# Patient Record
Sex: Male | Born: 1990 | Race: Black or African American | Hispanic: No | Marital: Single | Smoking: Current every day smoker
Health system: Southern US, Community
[De-identification: ages and names within clinical notes are randomized; demographics above are authoritative.]

---

## 2011-12-17 ENCOUNTER — Encounter (HOSPITAL_COMMUNITY): Payer: Self-pay | Admitting: Emergency Medicine

## 2011-12-17 ENCOUNTER — Emergency Department (HOSPITAL_COMMUNITY)
Admission: EM | Admit: 2011-12-17 | Discharge: 2011-12-17 | Disposition: A | Discharge status: Home / Self Care | Payer: BC Managed Care – PPO | Attending: Emergency Medicine | Admitting: Emergency Medicine

## 2011-12-17 DIAGNOSIS — G43909 Migraine, unspecified, not intractable, without status migrainosus: Secondary | ICD-10-CM | POA: Insufficient documentation

## 2011-12-17 DIAGNOSIS — F17200 Nicotine dependence, unspecified, uncomplicated: Secondary | ICD-10-CM | POA: Insufficient documentation

## 2011-12-17 DIAGNOSIS — H538 Other visual disturbances: Secondary | ICD-10-CM | POA: Insufficient documentation

## 2011-12-17 DIAGNOSIS — R51 Headache: Secondary | ICD-10-CM

## 2011-12-17 LAB — URINALYSIS, ROUTINE W REFLEX MICROSCOPIC
Ketones, ur: NEGATIVE mg/dL
Leukocytes, UA: NEGATIVE
Nitrite: NEGATIVE
Specific Gravity, Urine: <1.005 — ABNORMAL LOW (ref 1.005–1.030)
pH: 6.5 (ref 5.0–8.0)

## 2011-12-17 NOTE — ED Provider Notes (Addendum)
History   This chart was scribed for Melvin Hutching, MD by Charolett Bumpers . The patient was seen in room APA16A/APA16A. Patient's care was started at 1452.   CSN: [id number]  Arrival date & time 12/17/11  1343   First MD Initiated Contact with Patient 12/17/11 1452      Chief Complaint  Patient presents with  . Migraine  . Blurred Vision    The history is provided by the patient. No language interpreter was used.   Melvin Grant is a 21 y.o. male who presents to the Emergency Department complaining of constant, severe headache with associated blurry vision that started at work today. He reports it started behind right eye and is not in frontal area. He states his headache has improved and denies taking anything PTA. He denies any h/o headaches or migraines. He denies any recent illnesses. He denies any prior medical hx or regular medications. He states he occasional smokes cigars. He reports occasional alcohol use but none recently.   History reviewed. No pertinent past medical history.  History reviewed. No pertinent past surgical history.  No family history on file.  History  Substance Use Topics  . Smoking status: Current Some Day Smoker  . Smokeless tobacco: Not on file  . Alcohol Use: No      Review of Systems A complete 10 system review of systems was obtained and all systems are negative except as noted in the HPI and PMH.   Allergies  Review of patient's allergies indicates no known allergies.  Home Medications  No current outpatient prescriptions on file.  BP 122/65  Pulse 56  Temp 98.1 F (36.7 C) (Oral)  Resp 20  Ht 5\' 10"  (1.778 m)  Wt 147 lb (66.679 kg)  BMI 21.09 kg/m2  SpO2 100%  Physical Exam  Nursing note and vitals reviewed. Constitutional: He is oriented to person, place, and time. He appears well-developed and well-nourished.  HENT:  Head: Normocephalic and atraumatic.  Eyes: Conjunctivae normal and EOM are normal. Pupils are equal,  round, and reactive to light.  Neck: Normal range of motion. Neck supple.  Cardiovascular: Normal rate and regular rhythm.   No murmur heard. Pulmonary/Chest: Effort normal and breath sounds normal. No respiratory distress. He has no wheezes.  Abdominal: Soft. Bowel sounds are normal. He exhibits no distension. There is no tenderness.  Musculoskeletal: Normal range of motion.  Neurological: He is alert and oriented to person, place, and time. No cranial nerve deficit.  Skin: Skin is warm and dry.  Psychiatric: He has a normal mood and affect.    ED Course  Procedures (including critical care time)  DIAGNOSTIC STUDIES: Oxygen Saturation is 100% on room air, normal by my interpretation.    COORDINATION OF CARE:  15:38-Discussed planned course of treatment with the patient including Tylenol or Motrin for his headache, drink fluids and eating healthy, who is agreeable at this time.   Results for orders placed during the hospital encounter of 12/17/11  URINALYSIS, ROUTINE W REFLEX MICROSCOPIC      Component Value Range   Color, Urine YELLOW  YELLOW   APPearance CLEAR  CLEAR   Specific Gravity, Urine <1.005 (*) 1.005 - 1.030   pH 6.5  5.0 - 8.0   Glucose, UA NEGATIVE  NEGATIVE mg/dL   Hgb urine dipstick NEGATIVE  NEGATIVE   Bilirubin Urine NEGATIVE  NEGATIVE   Ketones, ur NEGATIVE  NEGATIVE mg/dL   Protein, ur NEGATIVE  NEGATIVE mg/dL   Urobilinogen, UA  0.2  0.0 - 1.0 mg/dL   Nitrite NEGATIVE  NEGATIVE   Leukocytes, UA NEGATIVE  NEGATIVE    No results found.   No diagnosis found.    MDM  Normal physical exam. Patient is at minimal risk for any neurological event   I personally performed the services described in this documentation, which was scribed in my presence. The recorded information has been reviewed and considered.       Melvin Hutching, MD 12/17/11 1609  Melvin Hutching, MD 12/17/11 [phone]

## 2011-12-17 NOTE — ED Notes (Addendum)
Pt c/o ha/blurry vision in right eye while at work today. Denies blurred vision at this time. Denies numbness/tingling/weakness. States he has had history of blurred vision/ha with dehydration.

## 2020-04-04 ENCOUNTER — Emergency Department (HOSPITAL_COMMUNITY)
Admission: EM | Admit: 2020-04-04 | Discharge: 2020-04-04 | Disposition: A | Discharge status: Home / Self Care | Payer: Self-pay | Attending: Emergency Medicine | Admitting: Emergency Medicine

## 2020-04-04 ENCOUNTER — Encounter (HOSPITAL_COMMUNITY): Payer: Self-pay

## 2020-04-04 ENCOUNTER — Emergency Department (HOSPITAL_COMMUNITY): Payer: Self-pay

## 2020-04-04 ENCOUNTER — Other Ambulatory Visit: Payer: Self-pay

## 2020-04-04 DIAGNOSIS — S6992XA Unspecified injury of left wrist, hand and finger(s), initial encounter: Secondary | ICD-10-CM

## 2020-04-04 DIAGNOSIS — F17290 Nicotine dependence, other tobacco product, uncomplicated: Secondary | ICD-10-CM | POA: Insufficient documentation

## 2020-04-04 DIAGNOSIS — W208XXA Other cause of strike by thrown, projected or falling object, initial encounter: Secondary | ICD-10-CM | POA: Insufficient documentation

## 2020-04-04 DIAGNOSIS — S60943A Unspecified superficial injury of left middle finger, initial encounter: Secondary | ICD-10-CM | POA: Insufficient documentation

## 2020-04-04 NOTE — ED Provider Notes (Signed)
[facility] EMERGENCY DEPARTMENT Provider Note   CSN: [id number] Arrival date & time: 04/04/20  2109     History Chief Complaint  Patient presents with  . Finger Injury    Melvin Grant is a 30 y.o. male.  HPI   Patient with no significant medical history presents with chief complaint of left middle finger injury.  Patient endorses he was moving shelves and a shelf landed on his left middle finger.  He states he had severe pain after incident, but is able to move his fingers outwith difficulty, states it was bleeding but was able to stop it with direct pressure.  He denies weakness or paresthesias in that finger, he is not immunocompromise, states his last tetanus was within the last 5 years.  He denies any alleviating factors.  Patient denies headaches, fevers, chills, shortness of breath, chest pain, abdominal pain, nausea, vomiting, diarrhea, pedal edema.  History reviewed. No pertinent past medical history.  There are no problems to display for this patient.   History reviewed. No pertinent surgical history.     History reviewed. No pertinent family history.  Social History   Tobacco Use  . Smoking status: Current Every Day Smoker    Types: Cigars  . Smokeless tobacco: Never Used  Substance Use Topics  . Alcohol use: Yes  . Drug use: No    Home Medications Prior to Admission medications   Not on File    Allergies    Patient has no known allergies.  Review of Systems   Review of Systems  Constitutional: Negative for chills and fever.  HENT: Negative for congestion.   Respiratory: Negative for shortness of breath.   Cardiovascular: Negative for chest pain.  Gastrointestinal: Negative for abdominal pain.  Genitourinary: Negative for enuresis.  Musculoskeletal: Negative for back pain.       Left middle finger pain.  Skin: Negative for rash.  Neurological: Negative for dizziness.  Hematological: Does not bruise/bleed easily.    Physical Exam Updated  Vital Signs BP 124/70   Pulse 67   Temp 98.2 F (36.8 C) (Oral)   Resp 18   Ht 5\' 9"  (1.753 m)   Wt 67.1 kg   SpO2 100%   BMI 21.86 kg/m   Physical Exam Vitals and nursing note reviewed.  Constitutional:      General: He is not in acute distress.    Appearance: He is not ill-appearing.  HENT:     Head: Normocephalic and atraumatic.     Nose: No congestion.  Eyes:     Conjunctiva/sclera: Conjunctivae normal.  Cardiovascular:     Rate and Rhythm: Normal rate and regular rhythm.  Pulmonary:     Effort: Pulmonary effort is normal.     Breath sounds: Normal breath sounds.  Musculoskeletal:        General: Swelling, tenderness and signs of injury present. No deformity.     Comments: Patient's left hand was visualized, he had a noted hematoma on the distal end of his left third digit, no nailbed involvement.  He had a small laceration at the distal end along the palmar aspect, 1 mm in length hemodynamically stable.  Patient had full range of motion at his MCP, PIP, DIP joint.  Neurovascular fully intact.  Skin:    General: Skin is warm and dry.  Neurological:     Mental Status: He is alert.  Psychiatric:        Mood and Affect: Mood normal.     ED Results /  Procedures / Treatments   Labs (all labs ordered are listed, but only abnormal results are displayed) Labs Reviewed - No data to display  EKG None  Radiology DG Hand Complete Left  Result Date: 04/04/2020 CLINICAL DATA:  Middle finger injury EXAM: LEFT HAND - COMPLETE 3+ VIEW COMPARISON:  None. FINDINGS: Acute mildly comminuted and displaced fracture involving the tuft of the third distal phalanx. No subluxation. No radiopaque foreign body. IMPRESSION: Acute mildly comminuted and displaced fracture involving the tuft of the third distal phalanx. Electronically Signed   By: Jasmine Pang M.D.   On: 04/04/2020 22:53    Procedures Procedures   Medications Ordered in ED Medications - No data to display  ED Course   I have reviewed the triage vital signs and the nursing notes.  Pertinent labs & imaging results that were available during my care of the patient were reviewed by me and considered in my medical decision making (see chart for details).    MDM Rules/Calculators/A&P                          Initial impression-patient presents with pain in his third left digit.  He is alert, does not appear in acute distress, vital signs reassuring.  Will obtain imaging for further evaluation.  Work-up-left hand x-ray reveals have a mildly communicated and displaced fracture involving the tuft of the third distal phalanx.  Rule out- low suspicion for ligament or tendon damage as area was palpated no gross defects noted, they had full range of motion at all of his joints in his third left digit.  Low suspicion for compartment syndrome as area was palpated it was soft to the touch, neurovascular fully intact.  Plan-patient has a distal tuft fracture will place patient in a static splint and have him follow-up with orthopedic surgery for further evaluation.  Vital signs have remained stable, no indication for hospital admission.  Patient given at home care as well strict return precautions.  Patient verbalized that they understood agreed to said plan.   Final Clinical Impression(s) / ED Diagnoses Final diagnoses:  Injury of finger of left hand, initial encounter    Rx / DC Orders ED Discharge Orders    None       Barnie Del 04/04/20 2304    Bethann Berkshire, MD 04/05/20 1154

## 2020-04-04 NOTE — Discharge Instructions (Addendum)
Seen here with a finger injury.  Imaging shows that you have a mildly communicated and displaced fracture involving the tuft of the third distal phalanx.  I have placed you in a splint please keep on.  I recommend over-the-counter pain medications like ibuprofen and or Tylenol every 6 hours as needed.  I recommend keeping the finger elevated and applying ice to areas as help decrease inflammation and swelling.  Please follow-up with orthopedics for further evaluation.  Please contact your early convenience.  Come back to the emergency department if you develop chest pain, shortness of breath, severe abdominal pain, uncontrolled nausea, vomiting, diarrhea.

## 2020-04-04 NOTE — ED Triage Notes (Signed)
Pt to er, pt states that he was putting in a metal shelf and the shelf dropped down and his L middle finger got pinched and cut, pt has dressing in place, bleeding is controled/ stopped at this time.

## 2020-04-11 ENCOUNTER — Ambulatory Visit (INDEPENDENT_AMBULATORY_CARE_PROVIDER_SITE_OTHER): Payer: Worker's Compensation | Admitting: Orthopedic Surgery

## 2020-04-11 ENCOUNTER — Encounter: Payer: Self-pay | Admitting: Orthopedic Surgery

## 2020-04-11 ENCOUNTER — Other Ambulatory Visit: Payer: Self-pay

## 2020-04-11 VITALS — BP 127/71 | HR 63 | Ht 69.0 in | Wt 138.5 lb

## 2020-04-11 DIAGNOSIS — S62663A Nondisplaced fracture of distal phalanx of left middle finger, initial encounter for closed fracture: Secondary | ICD-10-CM | POA: Diagnosis not present

## 2020-04-11 MED ORDER — HYDROCODONE-ACETAMINOPHEN 5-325 MG PO TABS: 1.0000 | ORAL_TABLET | Freq: Four times a day (QID) | ORAL | 0 refills | Status: DC | PRN

## 2020-04-11 MED ORDER — IBUPROFEN 800 MG PO TABS: 800.0000 mg | ORAL_TABLET | Freq: Three times a day (TID) | ORAL | 1 refills | Status: AC | PRN

## 2020-04-11 NOTE — Progress Notes (Signed)
NEW PROBLEM//OFFICE VISIT  Summary assessment and plan: 53 male left long finger tip inj  AROM  Remove current splint   Work: Patient will be out of work for 2 additional weeks  Given medication for pain  Follow-up in 2 weeks  Chief Complaint  Patient presents with  . Hand Injury    L middle finger/ DOI 04/04/20 Metal door fell on finger at work    30 yo male injured at work  Left long finger tip injury with distal phal fracture  Pain at finger tip      Review of Systems  Constitutional: Negative for chills and fever.  Respiratory: Negative for shortness of breath.   Cardiovascular: Negative for chest pain.   No medical problems reported no surgeries reported   No past medical history on file.  No past surgical history on file.  No family history on file. Social History   Tobacco Use  . Smoking status: Current Every Day Smoker    Types: Cigars  . Smokeless tobacco: Never Used  Substance Use Topics  . Alcohol use: Yes  . Drug use: No    No Known Allergies  Current Meds  Medication Sig  . HYDROcodone-acetaminophen (NORCO/VICODIN) 5-325 MG tablet Take 1 tablet by mouth every 6 (six) hours as needed for moderate pain.  Marland Kitchen ibuprofen (ADVIL) 800 MG tablet Take 1 tablet (800 mg total) by mouth every 8 (eight) hours as needed.    BP 127/71   Pulse 63   Ht 5\' 9"  (1.753 m)   Wt 138 lb 8 oz (62.8 kg)   BMI 20.45 kg/m   Physical Exam Constitutional:      General: He is not in acute distress.    Appearance: He is well-developed.     Comments: Well developed, well nourished Normal grooming and hygiene     Cardiovascular:     Comments: No peripheral edema Musculoskeletal:     Comments: Left long finger :  Tip of the finger has a longitudinal cut no sign of infection no bleeding hypersensitive decreased range of motion DIP and PIP joint alignment normal  Skin:    General: Skin is warm and dry.  Neurological:     Mental Status: He is alert and oriented  to person, place, and time.     Sensory: No sensory deficit.     Coordination: Coordination normal.     Gait: Gait normal.     Deep Tendon Reflexes: Reflexes are normal and symmetric.  Psychiatric:        Mood and Affect: Mood normal.        Behavior: Behavior normal.        Thought Content: Thought content normal.        Judgment: Judgment normal.     Comments: Affect normal       MEDICAL DECISION MAKING  A.  Encounter Diagnosis  Name Primary?  . Closed nondisplaced fracture of distal phalanx of left middle finger, initial encounter Yes    B. DATA ANALYSED:   IMAGING: Interpretation of images: external/ my interpretation is 3 x-rays of the hand of the fingertip shows a comminuted fracture of the distal phalanx with minimal displacement  Orders: no  Outside records reviewed: ER : xrays and splint    C. MANAGEMENT   AROM    Meds ordered this encounter  Medications  . ibuprofen (ADVIL) 800 MG tablet    Sig: Take 1 tablet (800 mg total) by mouth every 8 (eight) hours as needed.  Dispense:  90 tablet    Refill:  1  . HYDROcodone-acetaminophen (NORCO/VICODIN) 5-325 MG tablet    Sig: Take 1 tablet by mouth every 6 (six) hours as needed for moderate pain.    Dispense:  30 tablet    Refill:  0    OFFICE VISITS FOR THIS   Fuller Canada, MD  04/11/2020 11:47 AM

## 2020-04-11 NOTE — Patient Instructions (Addendum)
OOW 2 WEEKS   Smoking Tobacco Information, Adult Smoking tobacco can be harmful to your health. Tobacco contains a poisonous (toxic), colorless chemical called nicotine. Nicotine is addictive. It changes the brain and can make it hard to stop smoking. Tobacco also has other toxic chemicals that can hurt your body and raise your risk of many cancers. How can smoking tobacco affect me? Smoking tobacco puts you at risk for:  Cancer. Smoking is most commonly associated with lung cancer, but can also lead to cancer in other parts of the body.  Chronic obstructive pulmonary disease (COPD). This is a long-term lung condition that makes it hard to breathe. It also gets worse over time.  High blood pressure (hypertension), heart disease, stroke, or heart attack.  Lung infections, such as pneumonia.  Cataracts. This is when the lenses in the eyes become clouded.  Digestive problems. This may include peptic ulcers, heartburn, and gastroesophageal reflux disease (GERD).  Oral health problems, such as gum disease and tooth loss.  Loss of taste and smell. Smoking can affect your appearance by causing:  Wrinkles.  Yellow or stained teeth, fingers, and fingernails. Smoking tobacco can also affect your social life, because:  It may be challenging to find places to smoke when away from home. Many workplaces, [employer], hotels, and public places are tobacco-free.  Smoking is expensive. This is due to the cost of tobacco and the long-term costs of treating health problems from smoking.  Secondhand smoke may affect those around you. Secondhand smoke can cause lung cancer, breathing problems, and heart disease. Children of smokers have a higher risk for: ? Sudden infant death syndrome (SIDS). ? Ear infections. ? Lung infections. If you currently smoke tobacco, quitting now can help you:  Lead a longer and healthier life.  Look, smell, breathe, and feel better over time.  Save money.  Protect  others from the harms of secondhand smoke. What actions can I take to prevent health problems? Quit smoking  Do not start smoking. Quit if you already do.  Make a plan to quit smoking and commit to it. Look for programs to help you and ask your health care provider for recommendations and ideas.  Set a date and write down all the reasons you want to quit.  Let your friends and family know you are quitting so they can help and support you. Consider finding friends who also want to quit. It can be easier to quit with someone else, so that you can support each other.  Talk with your health care provider about using nicotine replacement medicines to help you quit, such as gum, lozenges, patches, sprays, or pills.  Do not replace cigarette smoking with electronic cigarettes, which are commonly called e-cigarettes. The safety of e-cigarettes is not known, and some may contain harmful chemicals.  If you try to quit but return to smoking, stay positive. It is common to slip up when you first quit, so take it one day at a time.  Be prepared for cravings. When you feel the urge to smoke, chew gum or suck on hard candy.   Lifestyle  Stay busy and take care of your body.  Drink enough fluid to keep your urine pale yellow.  Get plenty of exercise and eat a healthy diet. This can help prevent weight gain after quitting.  Monitor your eating habits. Quitting smoking can cause you to have a larger appetite than when you smoke.  Find ways to relax. Go out with friends or family  to a movie or a restaurant where people do not smoke.  Ask your health care provider about having regular tests (screenings) to check for cancer. This may include blood tests, imaging tests, and other tests.  Find ways to manage your stress, such as meditation, yoga, or exercise. Where to find support To get support to quit smoking, consider:  Asking your health care provider for more information and resources.  Taking  classes to learn more about quitting smoking.  Looking for local organizations that offer resources about quitting smoking.  Joining a support group for people who want to quit smoking in your local community.  Calling the smokefree.gov counselor helpline: 1-800-Quit-Now 640-618-8072) Where to find more information You may find more information about quitting smoking from:  HelpGuide.org: www.helpguide.org  [employer]: smokefree.gov  American Lung Association: www.lung.org Contact a health care provider if you:  Have problems breathing.  Notice that your lips, nose, or fingers turn blue.  Have chest pain.  Are coughing up blood.  Feel faint or you pass out.  Have other health changes that cause you to worry. Summary  Smoking tobacco can negatively affect your health, the health of those around you, your finances, and your social life.  Do not start smoking. Quit if you already do. If you need help quitting, ask your health care provider.  Think about joining a support group for people who want to quit smoking in your local community. There are many effective programs that will help you to quit this behavior. This information is not intended to replace advice given to you by your health care provider. Make sure you discuss any questions you have with your health care provider. Document Revised: 10/24/2018 Document Reviewed: 02/14/2016 Elsevier Patient Education  2021 [employer].

## 2020-04-25 ENCOUNTER — Ambulatory Visit (INDEPENDENT_AMBULATORY_CARE_PROVIDER_SITE_OTHER): Payer: Worker's Compensation | Admitting: Orthopedic Surgery

## 2020-04-25 ENCOUNTER — Other Ambulatory Visit: Payer: Self-pay

## 2020-04-25 ENCOUNTER — Encounter: Payer: Self-pay | Admitting: Orthopedic Surgery

## 2020-04-25 VITALS — BP 152/91 | HR 66 | Ht 69.0 in | Wt 139.0 lb

## 2020-04-25 DIAGNOSIS — S62663D Nondisplaced fracture of distal phalanx of left middle finger, subsequent encounter for fracture with routine healing: Secondary | ICD-10-CM | POA: Diagnosis not present

## 2020-04-25 NOTE — Patient Instructions (Signed)
Soak in warm water one teaspoon of salt for several minutes, this will help with healing and pain Rub with Soft tooth brush 10 minutes 3 times daily for a week, then change to Medium tooth brush 10 minutes 3 times daily, then use a hard tooth brush  Try to discontinue the finger splint when you can   Out of work for 3 weeks

## 2020-04-25 NOTE — Progress Notes (Signed)
Chief Complaint  Patient presents with  . Finger Injury    04/04/20 left middle finger    30 year old male status post nondisplaced fracture left middle fingertip  Patient is progressing well still has hypersensitivity to the tip of the finger  His range of motion today was good after we asked him to make a fist  He is 21 days post injury  Recommend desensitization's with a toothbrush therapy soft, medium, hard 1 week each for 10 minutes 3 times a day and then soaking 10 minutes a day with Epson salt  Follow-up in 3 weeks  No work for 3 weeks  Expect return to work in 3 weeks with MMI   Encounter Diagnosis  Name Primary?  . Closed nondisplaced fracture of distal phalanx of left middle finger with routine healing, subsequent encounter Yes

## 2020-05-16 ENCOUNTER — Ambulatory Visit (INDEPENDENT_AMBULATORY_CARE_PROVIDER_SITE_OTHER): Payer: Worker's Compensation | Admitting: Orthopedic Surgery

## 2020-05-16 ENCOUNTER — Other Ambulatory Visit: Payer: Self-pay

## 2020-05-16 ENCOUNTER — Encounter: Payer: Self-pay | Admitting: Orthopedic Surgery

## 2020-05-16 VITALS — BP 132/89 | HR 60 | Ht 69.0 in | Wt 141.0 lb

## 2020-05-16 DIAGNOSIS — S62663D Nondisplaced fracture of distal phalanx of left middle finger, subsequent encounter for fracture with routine healing: Secondary | ICD-10-CM

## 2020-05-16 NOTE — Progress Notes (Addendum)
Chief Complaint  Patient presents with  . Hand Injury    Left middle ring finger, Patient reports doing better     F/u LMF injury with radial distal tip numb   Otherwise pinch is normal; nail is a little hypertrophic   rom is normal   He can return to work Monday

## 2020-05-16 NOTE — Patient Instructions (Signed)
RTW April 11  FINGER COT APPLY TO TIP

## 2022-04-13 ENCOUNTER — Encounter: Payer: Self-pay | Admitting: Orthopedic Surgery

## 2022-04-13 ENCOUNTER — Ambulatory Visit (INDEPENDENT_AMBULATORY_CARE_PROVIDER_SITE_OTHER): Payer: BC Managed Care – PPO | Admitting: Orthopedic Surgery

## 2022-04-13 VITALS — BP 139/91 | HR 78 | Ht 69.0 in | Wt 144.0 lb

## 2022-04-13 DIAGNOSIS — M62830 Muscle spasm of back: Secondary | ICD-10-CM

## 2022-04-13 DIAGNOSIS — M898X1 Other specified disorders of bone, shoulder: Secondary | ICD-10-CM | POA: Diagnosis not present

## 2022-04-13 NOTE — Patient Instructions (Signed)
Give him a note OOW fri sat sun return to work Monday

## 2022-04-13 NOTE — Progress Notes (Signed)
Chief Complaint  Patient presents with   Back Pain    Mid left sided back pain    32 year old male [facility] February when he woke up with pain in his mid thoracic to upper thoracic spine.  He says he woke up and could not move and needed help to even get out of the bed.  He works at a [employer] all day but had no trouble after his most recent time at work.  He does play basketball every Sunday which she did on this occasion but had no issues until he woke up  At [facility] he was given cyclobenzaprine 10 mg Medrol Dosepak 4 mg and ibuprofen 800 mg and he says that he is improving but still has some discomfort in the periscapular region  I called [facility] and I was on the phone with them for 15 minutes as their fax of his notes did not transfer with the font that was readable  In any event his review of systems does not reveal any numbness tingling or neck discomfort currently  No leg pain or red flags  Physical Exam Vitals and nursing note reviewed.  Constitutional:      Appearance: Normal appearance.  HENT:     Head: Normocephalic and atraumatic.  Eyes:     General: No scleral icterus.       Right eye: No discharge.        Left eye: No discharge.     Extraocular Movements: Extraocular movements intact.     Conjunctiva/sclera: Conjunctivae normal.     Pupils: Pupils are equal, round, and reactive to light.  Cardiovascular:     Rate and Rhythm: Normal rate.     Pulses: Normal pulses.  Musculoskeletal:     Left shoulder: Tenderness present. No swelling, deformity, effusion, laceration or crepitus. Normal range of motion. Normal strength. Normal pulse.       Arms:     Thoracic back: Spasms and tenderness present. No swelling, edema, deformity, signs of trauma, lacerations or bony tenderness. Normal range of motion. No scoliosis.     Comments: Periscapular pain as noted in the diagram  Skin:    General: Skin is warm and dry.      Capillary Refill: Capillary refill takes less than 2 seconds.  Neurological:     General: No focal deficit present.     Mental Status: He is alert and oriented to person, place, and time.  Psychiatric:        Mood and Affect: Mood normal.        Behavior: Behavior normal.        Thought Content: Thought content normal.        Judgment: Judgment normal.      Assessment and plan  Encounter Diagnoses  Name Primary?   Periscapular pain Yes   Spasm of thoracic back muscle     Recommend the patient stay out of work until Monday.  He can go back Monday.  He should take his cyclobenzaprine, Medrol and ibuprofen and avoid any activity over the weekend

## 2022-08-08 IMAGING — DX DG HAND COMPLETE 3+V*L*
3 series · 3 of 3 positions shown · non-contrast
Comparison: None.

CLINICAL DATA: Middle finger injury

EXAM:
LEFT HAND - COMPLETE 3+ VIEW

[hand pa]
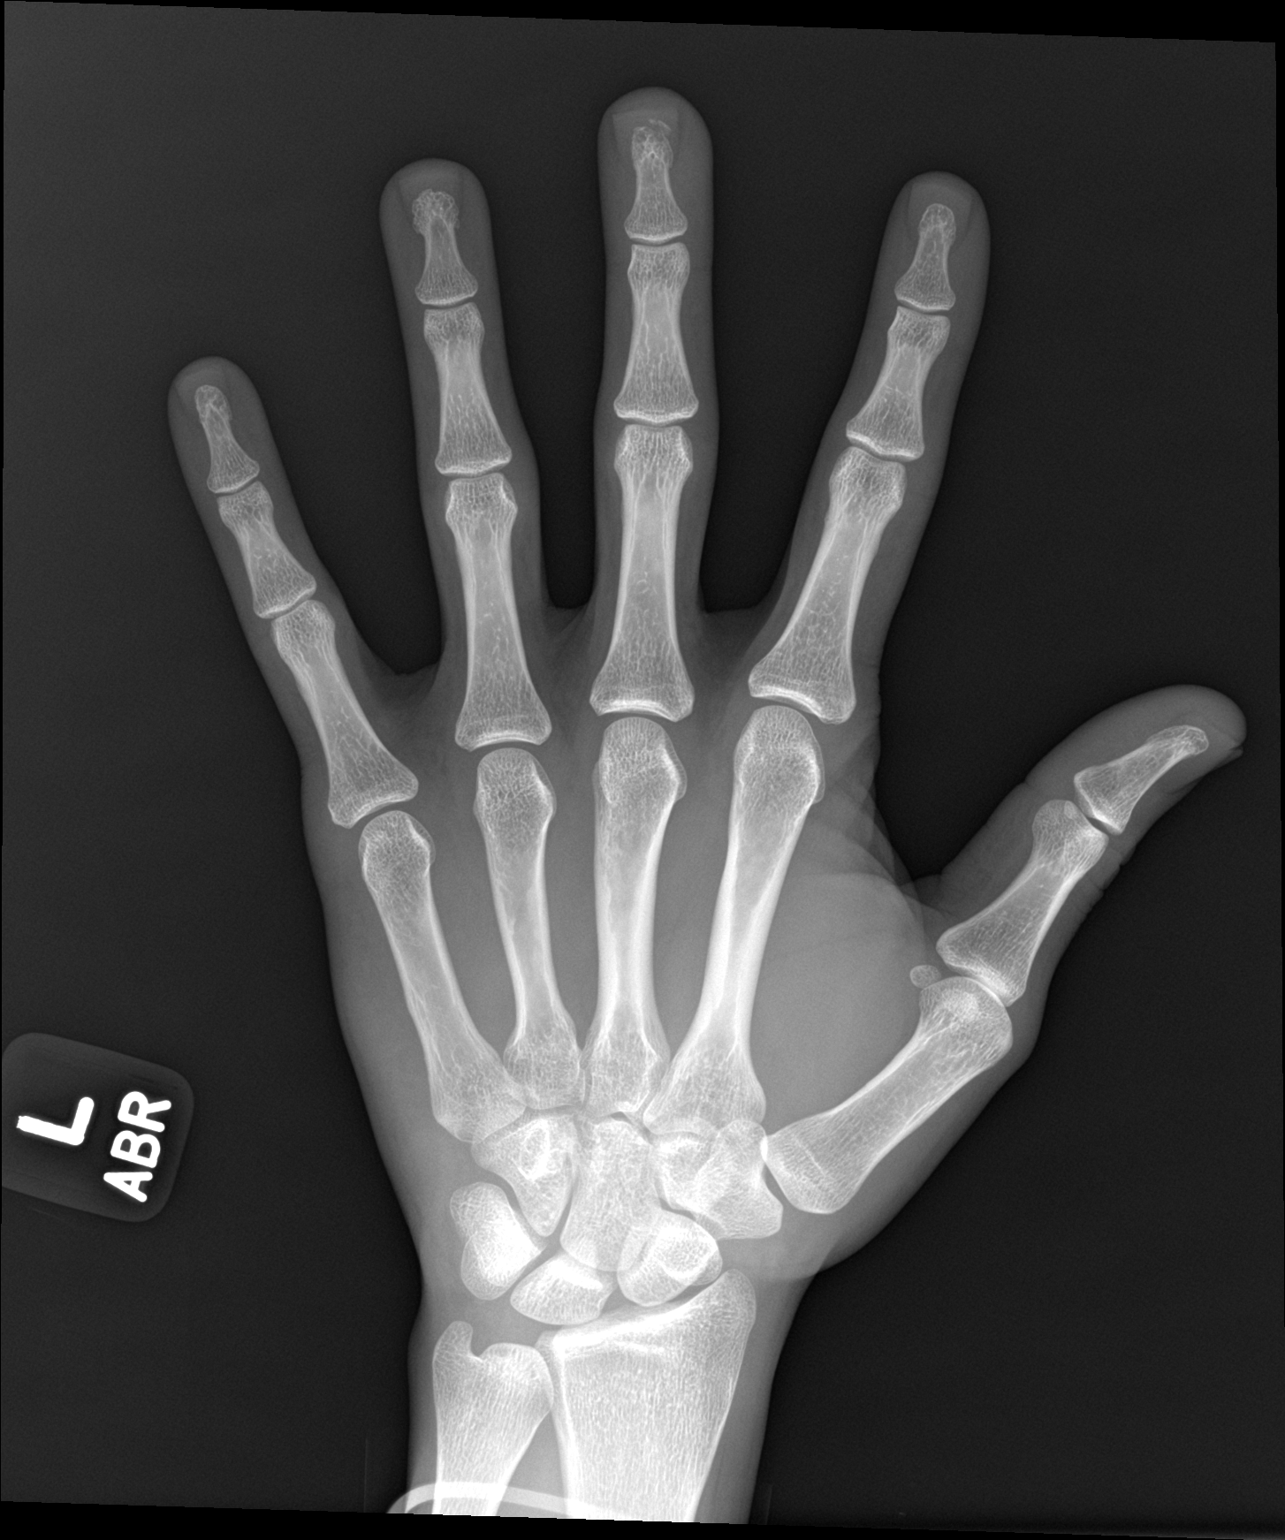

[hand obl]
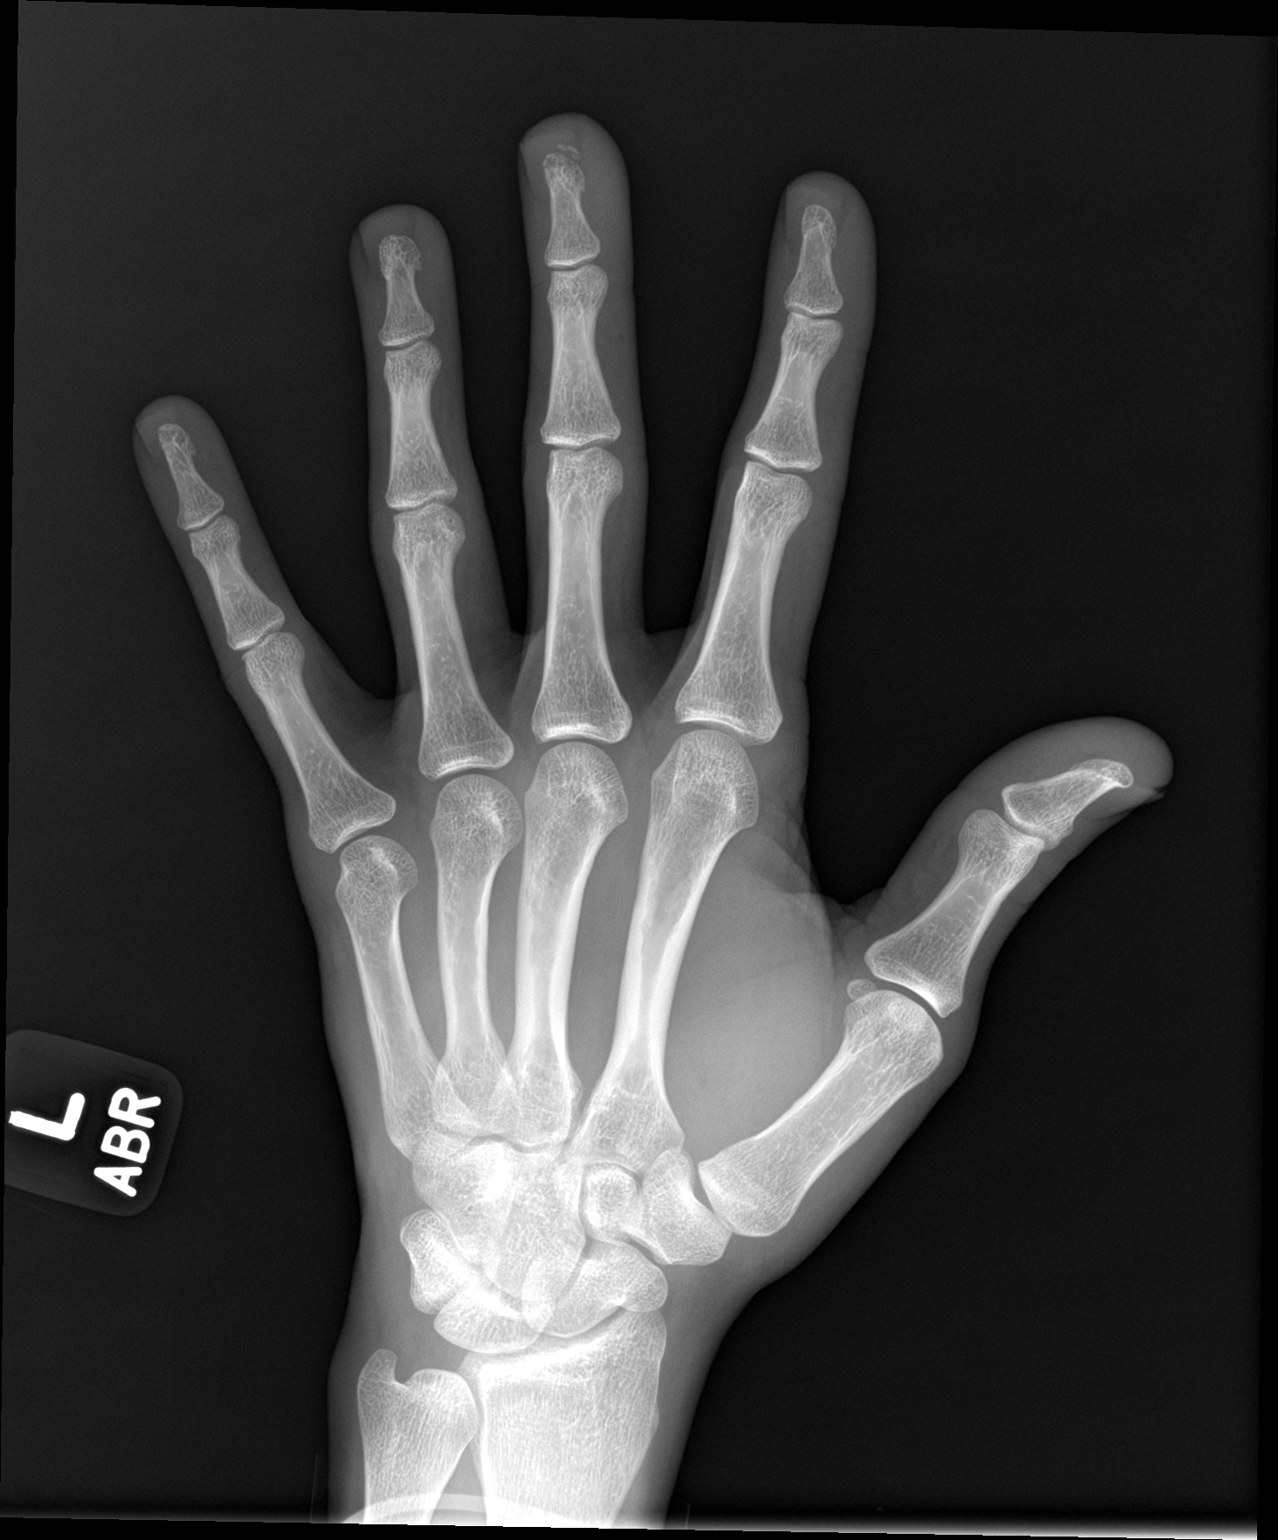

[hand lat]
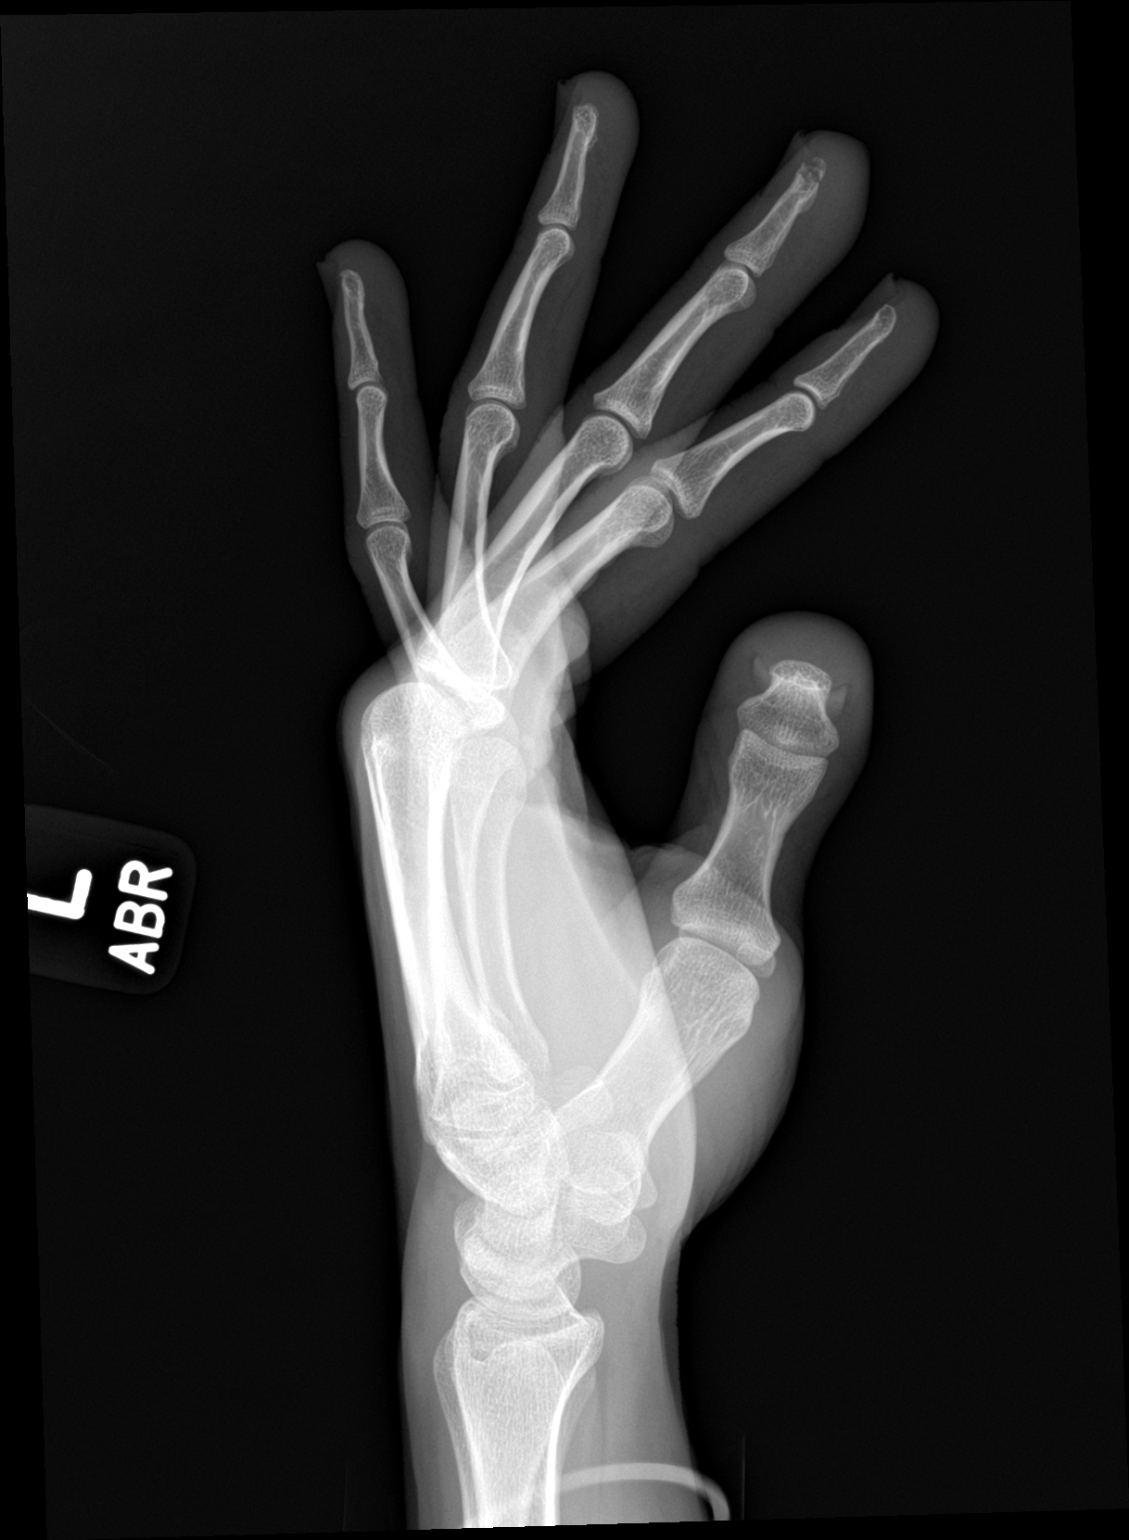

[3 of 3 positions shown; findings below may reference images not displayed]

FINDINGS: Acute mildly comminuted and displaced fracture involving the tuft of
the third distal phalanx. No subluxation. No radiopaque foreign
body.
IMPRESSION: Acute mildly comminuted and displaced fracture involving the tuft of
the third distal phalanx.

## 2022-10-14 ENCOUNTER — Encounter (HOSPITAL_COMMUNITY): Payer: Self-pay | Admitting: Emergency Medicine

## 2022-10-14 ENCOUNTER — Emergency Department (HOSPITAL_COMMUNITY)
Admission: EM | Admit: 2022-10-14 | Discharge: 2022-10-14 | Disposition: A | Discharge status: Home / Self Care | Payer: BC Managed Care – PPO | Attending: Emergency Medicine | Admitting: Emergency Medicine

## 2022-10-14 ENCOUNTER — Other Ambulatory Visit: Payer: Self-pay

## 2022-10-14 DIAGNOSIS — S40861A Insect bite (nonvenomous) of right upper arm, initial encounter: Secondary | ICD-10-CM | POA: Insufficient documentation

## 2022-10-14 DIAGNOSIS — S60562A Insect bite (nonvenomous) of left hand, initial encounter: Secondary | ICD-10-CM | POA: Insufficient documentation

## 2022-10-14 DIAGNOSIS — W57XXXA Bitten or stung by nonvenomous insect and other nonvenomous arthropods, initial encounter: Secondary | ICD-10-CM | POA: Diagnosis not present

## 2022-10-14 MED ORDER — HYDROCODONE-ACETAMINOPHEN 5-325 MG PO TABS
1.0000 | ORAL_TABLET | Freq: Once | ORAL | Status: AC
  Administered 2022-10-14: 1 via ORAL
  Filled 2022-10-14: qty 1

## 2022-10-14 MED ORDER — HYDROCODONE-ACETAMINOPHEN 5-325 MG PO TABS: 1.0000 | ORAL_TABLET | Freq: Four times a day (QID) | ORAL | 0 refills | Status: AC | PRN

## 2022-10-14 MED ORDER — DIPHENHYDRAMINE HCL 25 MG PO CAPS
25.0000 mg | ORAL_CAPSULE | Freq: Once | ORAL | Status: AC
  Administered 2022-10-14: 25 mg via ORAL
  Filled 2022-10-14: qty 1

## 2022-10-14 NOTE — ED Provider Notes (Signed)
[facility] Provider Note   CSN: [id number] Arrival date & time: 10/14/22  1951     History  Chief Complaint  Patient presents with   Insect Bite    Melvin Grant is a 32 y.o. male.  Patient to ED after multiple insect stings around 17:00 this afternoon. He reports swelling and redness to right upper arm x 2 and left hand x 1. No SOB, rash, wheezing.  No history of allergic reaction in the past. No nausea. He took Benadryl without significant relief.   The history is provided by the patient. No language interpreter was used.       Home Medications Prior to Admission medications   Medication Sig Start Date End Date Taking? Authorizing Provider  HYDROcodone-acetaminophen (NORCO) 5-325 MG tablet Take 1 tablet by mouth every 6 (six) hours as needed for moderate pain. 10/14/22  Yes Lionell Matuszak, Melvenia Beam, PA-C  ibuprofen (ADVIL) 800 MG tablet Take 1 tablet (800 mg total) by mouth every 8 (eight) hours as needed. 04/11/20   Vickki Hearing, MD      Allergies    Patient has no known allergies.    Review of Systems   Review of Systems  Physical Exam Updated Vital Signs BP 133/77 (BP Location: Left Arm)   Pulse (!) 57   Temp 98.5 F (36.9 C) (Oral)   Resp 16   Ht 5\' 10"  (1.778 m)   Wt 69.4 kg   SpO2 98%   BMI 21.95 kg/m  Physical Exam Vitals and nursing note reviewed.  Constitutional:      Appearance: Normal appearance.  HENT:     Mouth/Throat:     Mouth: Mucous membranes are moist.     Comments: Normal speech, no tongue swelling. Pulmonary:     Breath sounds: No stridor. No wheezing.  Musculoskeletal:        General: Normal range of motion.     Cervical back: Normal range of motion and neck supple.  Skin:    Findings: Erythema present.     Comments: Redness and mild induration surrounding 2 lesions c/w with insect sting to posterior right upper arm. Similar finding to left hand over ulnar aspect. No bleeding. No hives.    Neurological:     Mental Status: He is alert and oriented to person, place, and time.     Sensory: No sensory deficit.     ED Results / Procedures / Treatments   Labs (all labs ordered are listed, but only abnormal results are displayed) Labs Reviewed - No data to display  EKG None  Radiology No results found.  Procedures Procedures    Medications Ordered in ED Medications  HYDROcodone-acetaminophen (NORCO/VICODIN) 5-325 MG per tablet 1 tablet (1 tablet Oral Given 10/14/22 2318)  diphenhydrAMINE (BENADRYL) capsule 25 mg (25 mg Oral Given 10/14/22 2318)    ED Course/ Medical Decision Making/ A&P Clinical Course as of 10/14/22 2319  Sun Oct 14, 2022  2315 Suspect localized reaction to insect sting x 3. No concern for allergic reaction. Will provide pain relief and instruction to continue benadryl.  [SU]    Clinical Course User Index [SU] Elpidio Anis, PA-C                                 Medical Decision Making Risk Prescription drug management.           Final Clinical Impression(s) / ED  Diagnoses Final diagnoses:  Insect bite of right upper arm, initial encounter  Insect bite of left hand, initial encounter    Rx / DC Orders ED Discharge Orders          Ordered    HYDROcodone-acetaminophen (NORCO) 5-325 MG tablet  Every 6 hours PRN        10/14/22 2317              Elpidio Anis, PA-C 10/14/22 2319    Vanetta Mulders, MD 10/14/22 2330

## 2022-10-14 NOTE — ED Notes (Signed)
Introduced self to pt and attached to partial monitor Pt stated that he was stung x3 by a black bee around 15:00 Pt has swelling to LEFT hand, 1 bite Swelling and redness the back or RIGHT upper arm, 2 bites.   Pt denies SOB  Vitals listed Call bell on stretcher Waiting for orders from provider

## 2022-10-14 NOTE — Discharge Instructions (Signed)
You can take Norco for pain as directed. Add Benadryl 25 mg every 4-6 hours as well. Cool compresses with help reduce the swelling.

## 2022-10-14 NOTE — ED Notes (Signed)
Vitals assessed Pt complains of pain and throbbing in LEFT hand Waiting on provider eval

## 2022-10-14 NOTE — ED Triage Notes (Signed)
Pt c/o multiple stings while mowing at about 5pm today. 1 to left pinky finger, 2 to right upper arm.
